# Patient Record
Sex: Male | Born: 1995 | Race: White | Hispanic: No | Marital: Single | Smoking: Never smoker
Health system: Southern US, Community
[De-identification: ages and names within clinical notes are randomized; demographics above are authoritative.]

## PROBLEM LIST (undated history)

## (undated) DIAGNOSIS — J45909 Unspecified asthma, uncomplicated: Secondary | ICD-10-CM

## (undated) HISTORY — PX: CHOLECYSTECTOMY: SHX55

---
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
## 2015-07-17 IMAGING — CT CT ABD-PELV W/ CM
2 of 4 series · 17 of 46 positions shown, 19 images · IV contrast (APPLIED)
Comparison: None.

CLINICAL DATA: Abdominal pain, nausea and vomiting for 3 days.

EXAM:
CT ABDOMEN AND PELVIS WITH CONTRAST
TECHNIQUE: Multidetector CT imaging of the abdomen and pelvis was performed
using the standard protocol following bolus administration of
intravenous contrast.
CONTRAST:  100 mL OMNIPAQUE IOHEXOL 300 MG/ML  SOLN

[Series 2: abd/pelvis 5.0 b31f · axial · 0.78mm/px · z∈[+810,+1250]mm · 14 of 96 slices shown, 16 images]
[im 4/96  soft-tissue]
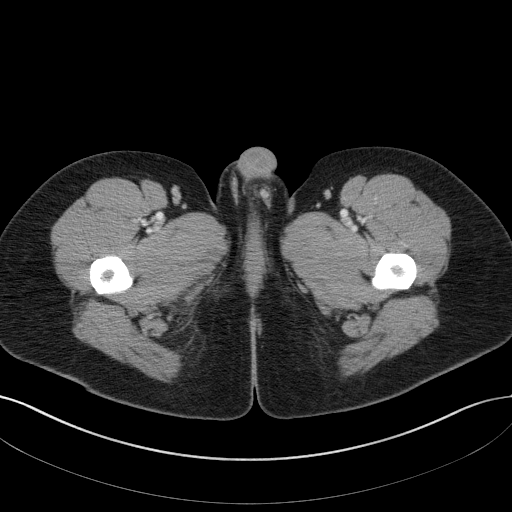
[im 4/96  bone]
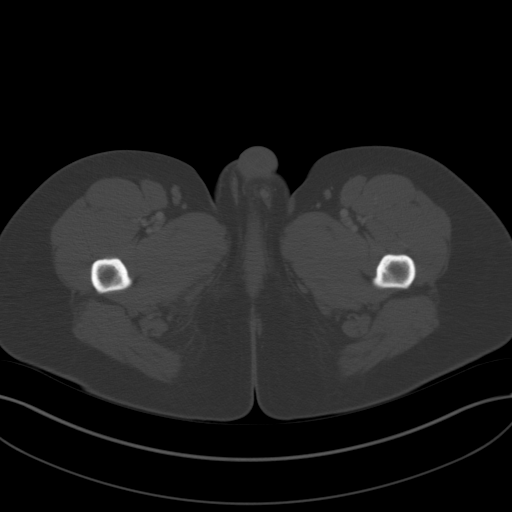
[im 12/96  soft-tissue]
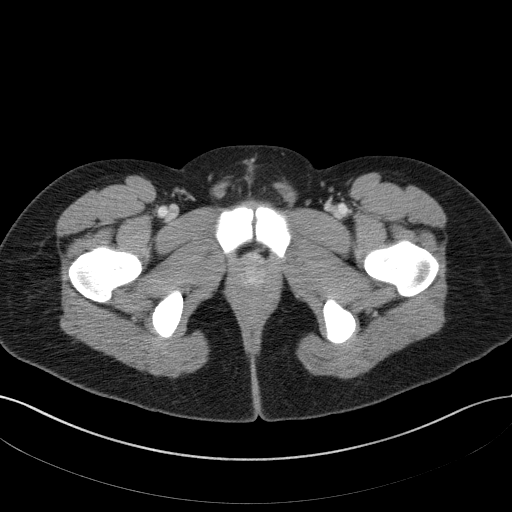
[im 20/96  soft-tissue]
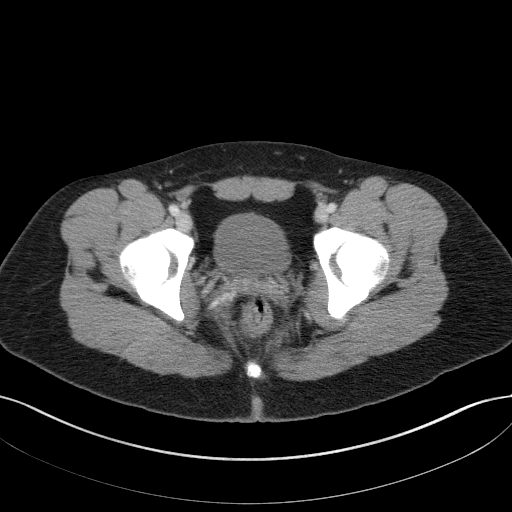
[im 24/96  soft-tissue]
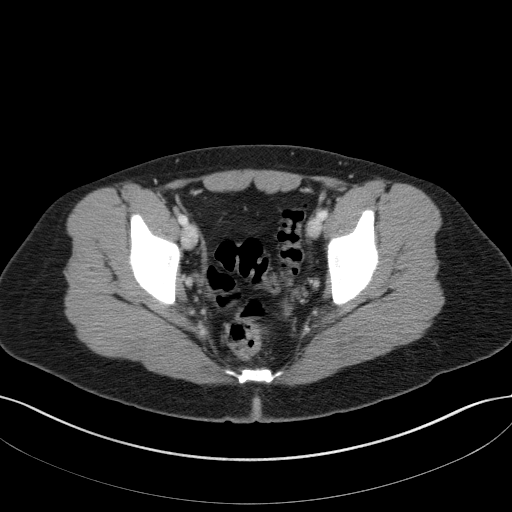
[im 32/96  soft-tissue]
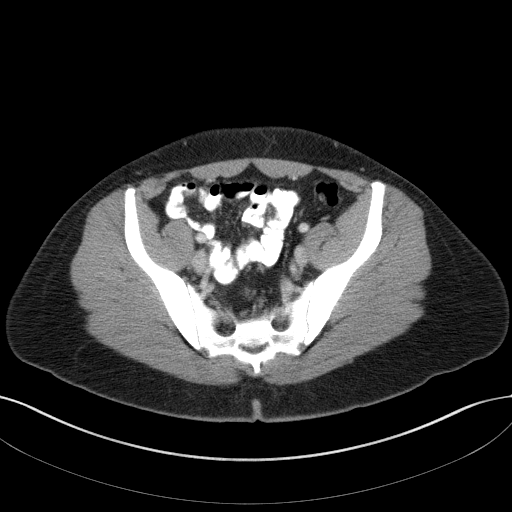
[im 40/96  soft-tissue]
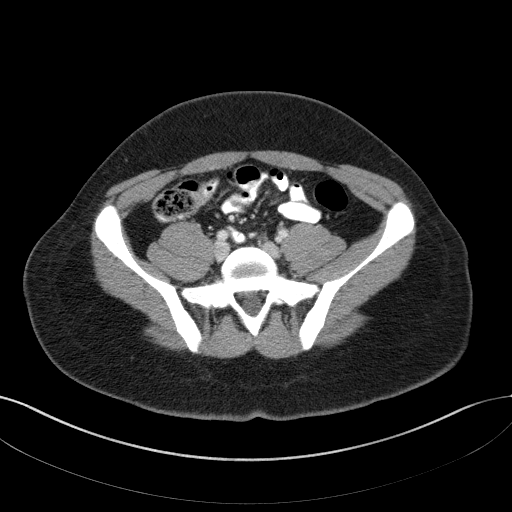
[im 44/96  soft-tissue]
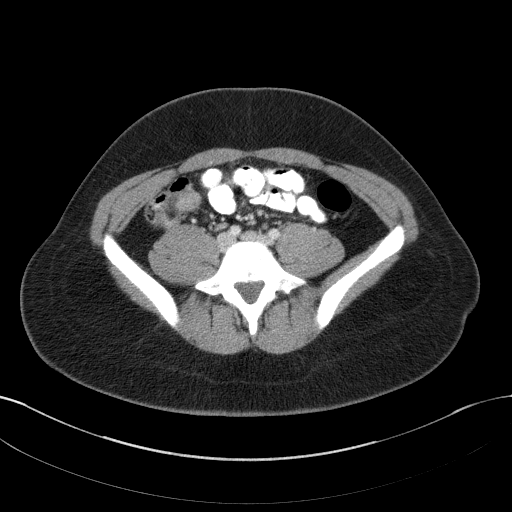
[im 52/96  soft-tissue]
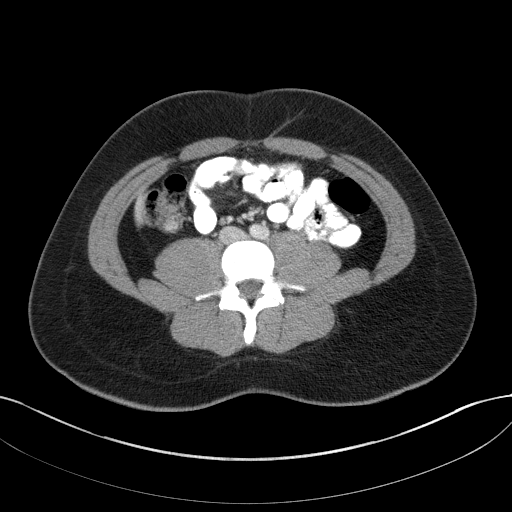
[im 56/96  soft-tissue]
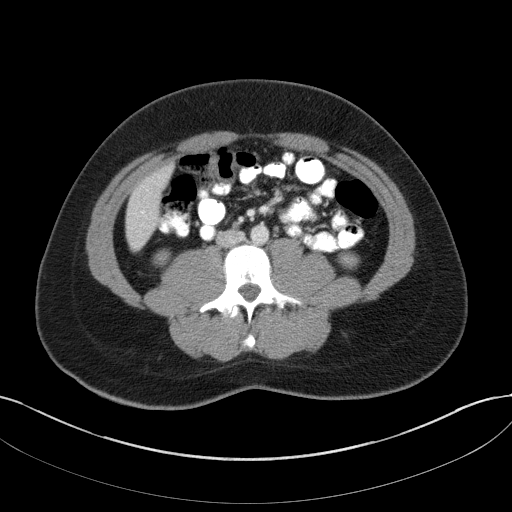
[im 56/96  bone]
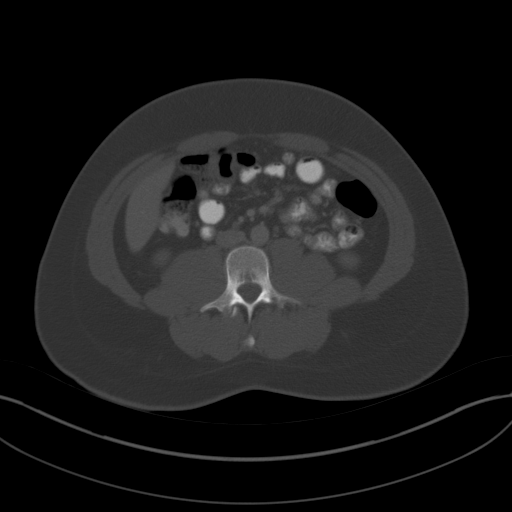
[im 64/96  soft-tissue]
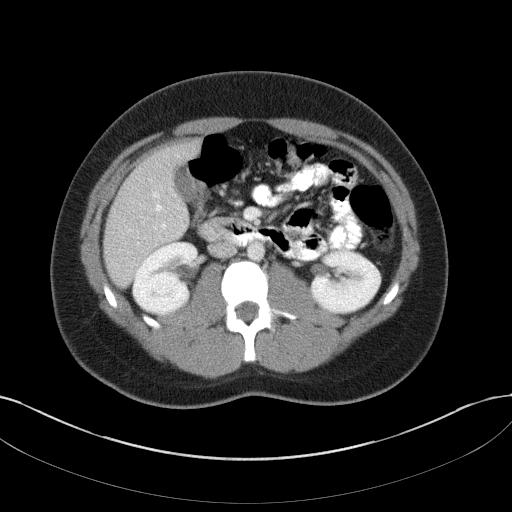
[im 72/96  soft-tissue]
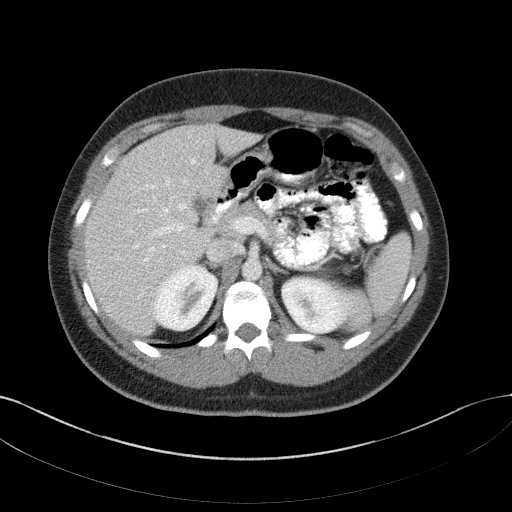
[im 76/96  soft-tissue]
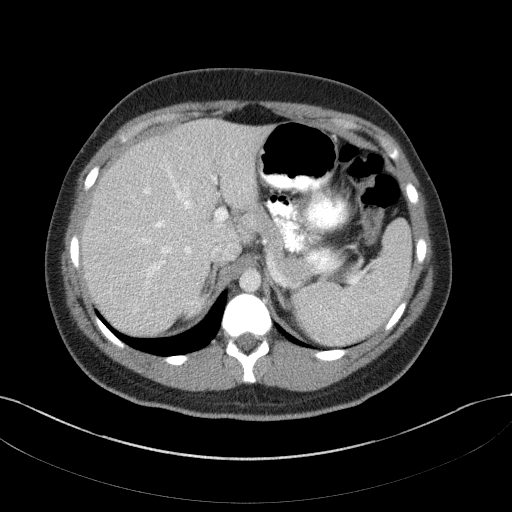
[im 84/96  soft-tissue]
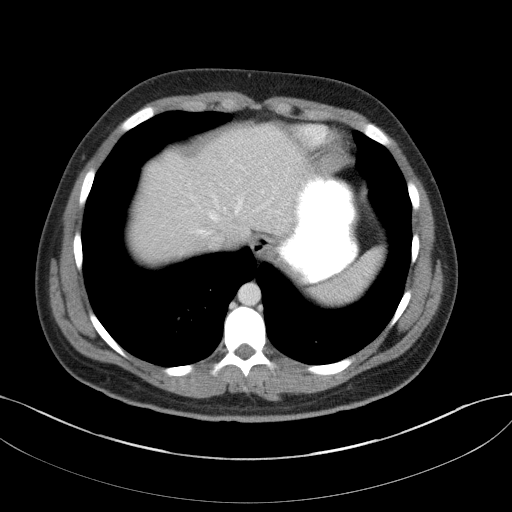
[im 92/96  soft-tissue]
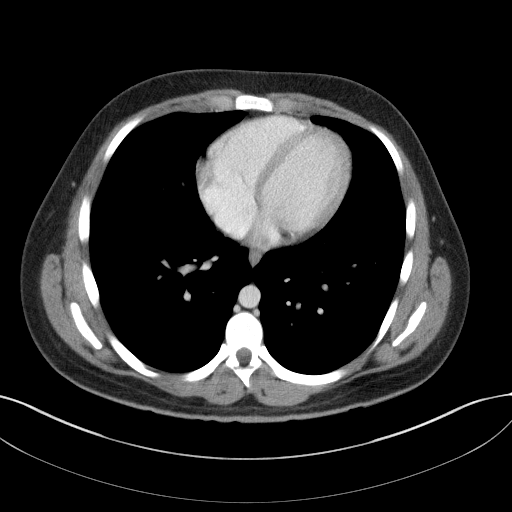

[Series 5: abd/pelvis 3.0 coronal · coronal · 0.87mm/px · 3 of 90 slices shown]
[im 30/90  soft-tissue]
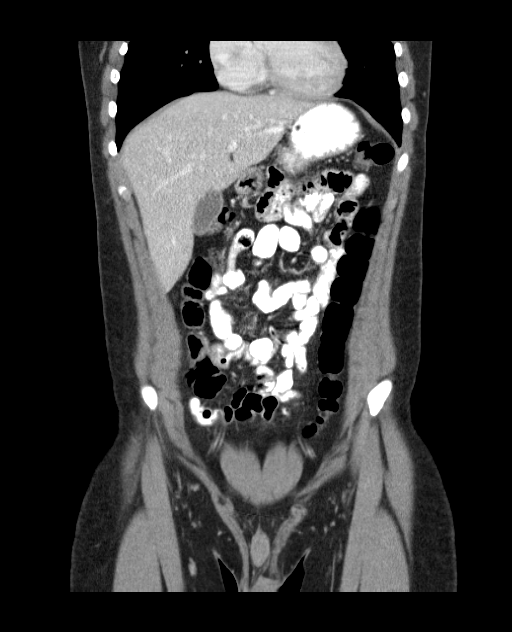
[im 40/90  soft-tissue]
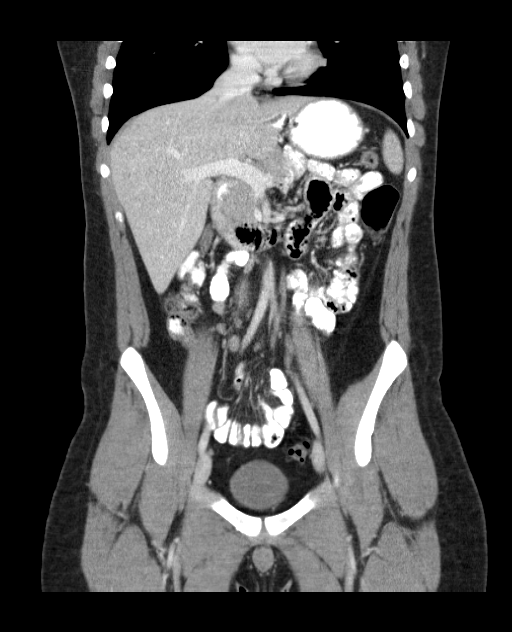
[im 50/90  soft-tissue]
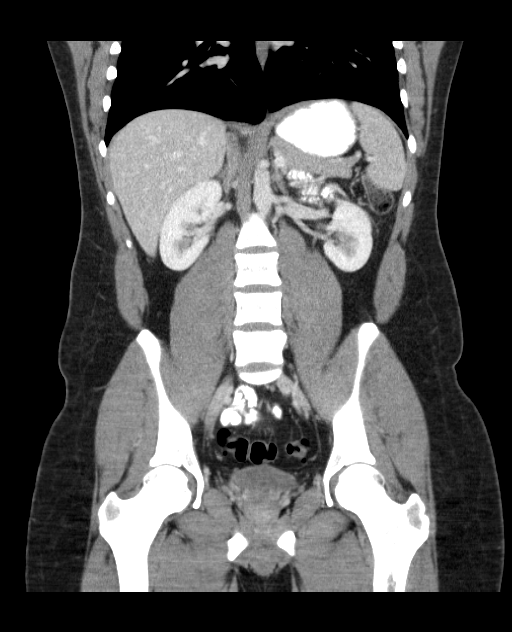

[17 of 46 positions shown; findings below may reference images not displayed]

FINDINGS: The lung bases are clear.  No pleural or pericardial effusion.

A single tiny stone is identified in the gallbladder. There is no CT
evidence of cholecystitis. The liver, spleen, adrenal glands,
pancreas and kidneys appear normal. The stomach, small and large
bowel and appendix appear normal. There is no lymphadenopathy or
fluid. No focal bony abnormality is identified.
IMPRESSION: No acute finding abdomen or pelvis.

A single tiny gallstone is seen but there is no evidence of
cholecystitis.

## 2016-08-05 ENCOUNTER — Emergency Department (HOSPITAL_BASED_OUTPATIENT_CLINIC_OR_DEPARTMENT_OTHER)
Admission: EM | Admit: 2016-08-05 | Discharge: 2016-08-05 | Disposition: A | Discharge status: Home / Self Care | Payer: Worker's Compensation | Attending: Emergency Medicine | Admitting: Emergency Medicine

## 2016-08-05 ENCOUNTER — Encounter (HOSPITAL_BASED_OUTPATIENT_CLINIC_OR_DEPARTMENT_OTHER): Payer: Self-pay

## 2016-08-05 ENCOUNTER — Emergency Department (HOSPITAL_BASED_OUTPATIENT_CLINIC_OR_DEPARTMENT_OTHER): Payer: Worker's Compensation

## 2016-08-05 DIAGNOSIS — J45909 Unspecified asthma, uncomplicated: Secondary | ICD-10-CM | POA: Insufficient documentation

## 2016-08-05 DIAGNOSIS — Z23 Encounter for immunization: Secondary | ICD-10-CM | POA: Insufficient documentation

## 2016-08-05 DIAGNOSIS — W268XXS Contact with other sharp object(s), not elsewhere classified, sequela: Secondary | ICD-10-CM | POA: Insufficient documentation

## 2016-08-05 DIAGNOSIS — S61212S Laceration without foreign body of right middle finger without damage to nail, sequela: Secondary | ICD-10-CM | POA: Insufficient documentation

## 2016-08-05 MED ORDER — TETANUS-DIPHTH-ACELL PERTUSSIS 5-2.5-18.5 LF-MCG/0.5 IM SUSP
0.5000 mL | Freq: Once | INTRAMUSCULAR | Status: AC
  Administered 2016-08-05: 0.5 mL via INTRAMUSCULAR
  Filled 2016-08-05: qty 0.5

## 2016-08-05 NOTE — ED Provider Notes (Addendum)
[facility] Provider [facility]   CSN: [id number] Arrival date & time: 08/05/16  1953   By signing my name below, I, Clovis Pu, attest that this documentation has been prepared under the direction and in the presence of Loren Racer, MD  Electronically Signed: Clovis Pu, ED Scribe. 08/05/16. 9:09 PM.   History   Chief Complaint Chief Complaint  Patient presents with  . Finger Injury   The history is provided by the patient. No language interpreter was used.   HPI Comments:  Anthony Oconnor is a 20 y.o. male who presents to the Emergency Department complaining of a laceration to his R Middle finger which occurred 30-40 minutes PTA. Pt states he cut it on the side of a clean dishwasher. He is R-hand dominant. Tetanus status is unknown. No alleviating factors noted. Pt denies any other complaints at this time.   Past Medical History:  Diagnosis Date  . Asthma     There are no active problems to display for this patient.   Past Surgical History:  Procedure Laterality Date  . CHOLECYSTECTOMY         Home Medications    Prior to Admission medications   Not on File    Family History No family history on file.  Social History Social History  Substance Use Topics  . Smoking status: Never Smoker  . Smokeless tobacco: Never Used  . Alcohol use No     Allergies   Penicillins   Review of Systems Review of Systems  Skin: Positive for wound.  Neurological: Negative for weakness and numbness.  All other systems reviewed and are negative.    Physical Exam Updated Vital Signs BP 142/90 (BP Location: Left Arm)   Pulse 100   Temp 98.6 F (37 C) (Oral)   Resp 20   Ht 5\' 7"  (1.702 m)   Wt 185 lb (83.9 kg)   SpO2 99%   BMI 28.98 kg/m   Physical Exam  Constitutional: He is oriented to person, place, and time. He appears well-developed and well-nourished.  HENT:  Head: Normocephalic and atraumatic.  Eyes: EOM are normal. Pupils are equal,  round, and reactive to light.  Neck: Normal range of motion. Neck supple.  Cardiovascular: Normal rate.   Pulmonary/Chest: Effort normal.  Abdominal: Soft. There is no tenderness. There is no rebound and no guarding.  Musculoskeletal: Normal range of motion. He exhibits no edema or tenderness.  Patient with 2 cm dogear laceration to the dorsum of the right middle finger just distal to the DIP joint. Appears superficial in nature. No underlying ligamentous injury appreciated. Mild bleeding. Good distal cap refill. No gross contamination. Patient has full range of motion of the MCP DIP and PIP joints of the middle finger of the right hand.  Neurological: He is alert and oriented to person, place, and time.  Sensation is fully intact. Good flexion and extension of the fingers of the right hand.  Skin: Skin is warm and dry. Capillary refill takes less than 2 seconds. No rash noted. No erythema.  Psychiatric: He has a normal mood and affect. His behavior is normal.  Nursing note and vitals reviewed.    ED Treatments / Results  DIAGNOSTIC STUDIES:  Oxygen Saturation is 99% on RA, normal by my interpretation.    COORDINATION OF CARE:  9:08 PM Discussed treatment plan with pt at bedside and pt agreed to plan.  Labs (all labs ordered are listed, but only abnormal results are displayed) Labs Reviewed -  No data to display  EKG  EKG Interpretation None       Radiology Dg Hand Complete Right  Result Date: 08/05/2016 CLINICAL DATA:  Third digit laceration EXAM: RIGHT HAND - COMPLETE 3+ VIEW COMPARISON:  None. FINDINGS: Bandaging is noted in place. No acute bony abnormality is seen. No soft tissue foreign body is seen. IMPRESSION: No acute abnormality noted. Electronically Signed   By: Alcide CleverMark  Lukens M.D.   On: 08/05/2016 21:45    Procedures Procedures (including critical care time) LACERATION REPAIR Performed by: Loren RacerYELVERTON, Marnell Mcdaniel Authorized by: Ranae PalmsYELVERTON, Nancy Arvin Consent: Verbal consent  obtained. Risks and benefits: risks, benefits and alternatives were discussed Consent given by: patient Patient identity confirmed: provided demographic data Prepped and Draped in normal sterile fashion Wound explored  Laceration Location: Right middle finger  Laceration Length: 2cm  No Foreign Bodies seen or palpated  Anesthesia: None     Irrigation method: syringe Amount of cleaning: standard  Skin closure: The skin adhesive and Steri-Strips   Number of sutures: N/a   Patient tolerance: Patient tolerated the procedure well with no immediate complications. Medications Ordered in ED Medications  Tdap (BOOSTRIX) injection 0.5 mL (0.5 mLs Intramuscular Given 08/05/16 2115)     Initial Impression / Assessment and Plan / ED Course  I have reviewed the triage vital signs and the nursing notes.  Pertinent labs & imaging results that were available during my care of the patient were reviewed by me and considered in my medical decision making (see chart for details).  Clinical Course    Tetanus was updated. Wound was irrigated thoroughly. Steri-Strips and skin adhesive glue placed. Bleeding is controlled.  Final Clinical Impressions(s) / ED Diagnoses   Final diagnoses:  Laceration of right middle finger without foreign body without damage to nail, sequela    New Prescriptions New Prescriptions   No medications on file  I personally performed the services described in this documentation, which was scribed in my presence. The recorded information has been reviewed and is accurate.       Loren Raceravid Trenna Kiely, MD 08/05/16 2228    Loren Raceravid Oakley Orban, MD 08/05/16 2245

## 2016-08-05 NOTE — ED Notes (Signed)
Patient supervisor present to verify patient identity.
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
## 2016-08-05 NOTE — ED Notes (Signed)
Patient transported to X-ray 

## 2016-08-05 NOTE — ED Notes (Signed)
Finger irrigated with normal saline and nonstick pressure dressing put on for xray

## 2016-08-05 NOTE — ED Triage Notes (Addendum)
Cut right middle finger on side of dishwasher at work approx 20 min PTA-lac noted-no bleeding-gauze,kling dsg applied in triage

## 2017-09-08 IMAGING — CR DG HAND COMPLETE 3+V*R*
3 series · 3 of 3 positions shown · non-contrast
Comparison: None.

CLINICAL DATA: Third digit laceration

EXAM:
RIGHT HAND - COMPLETE 3+ VIEW

[x hand pa right]
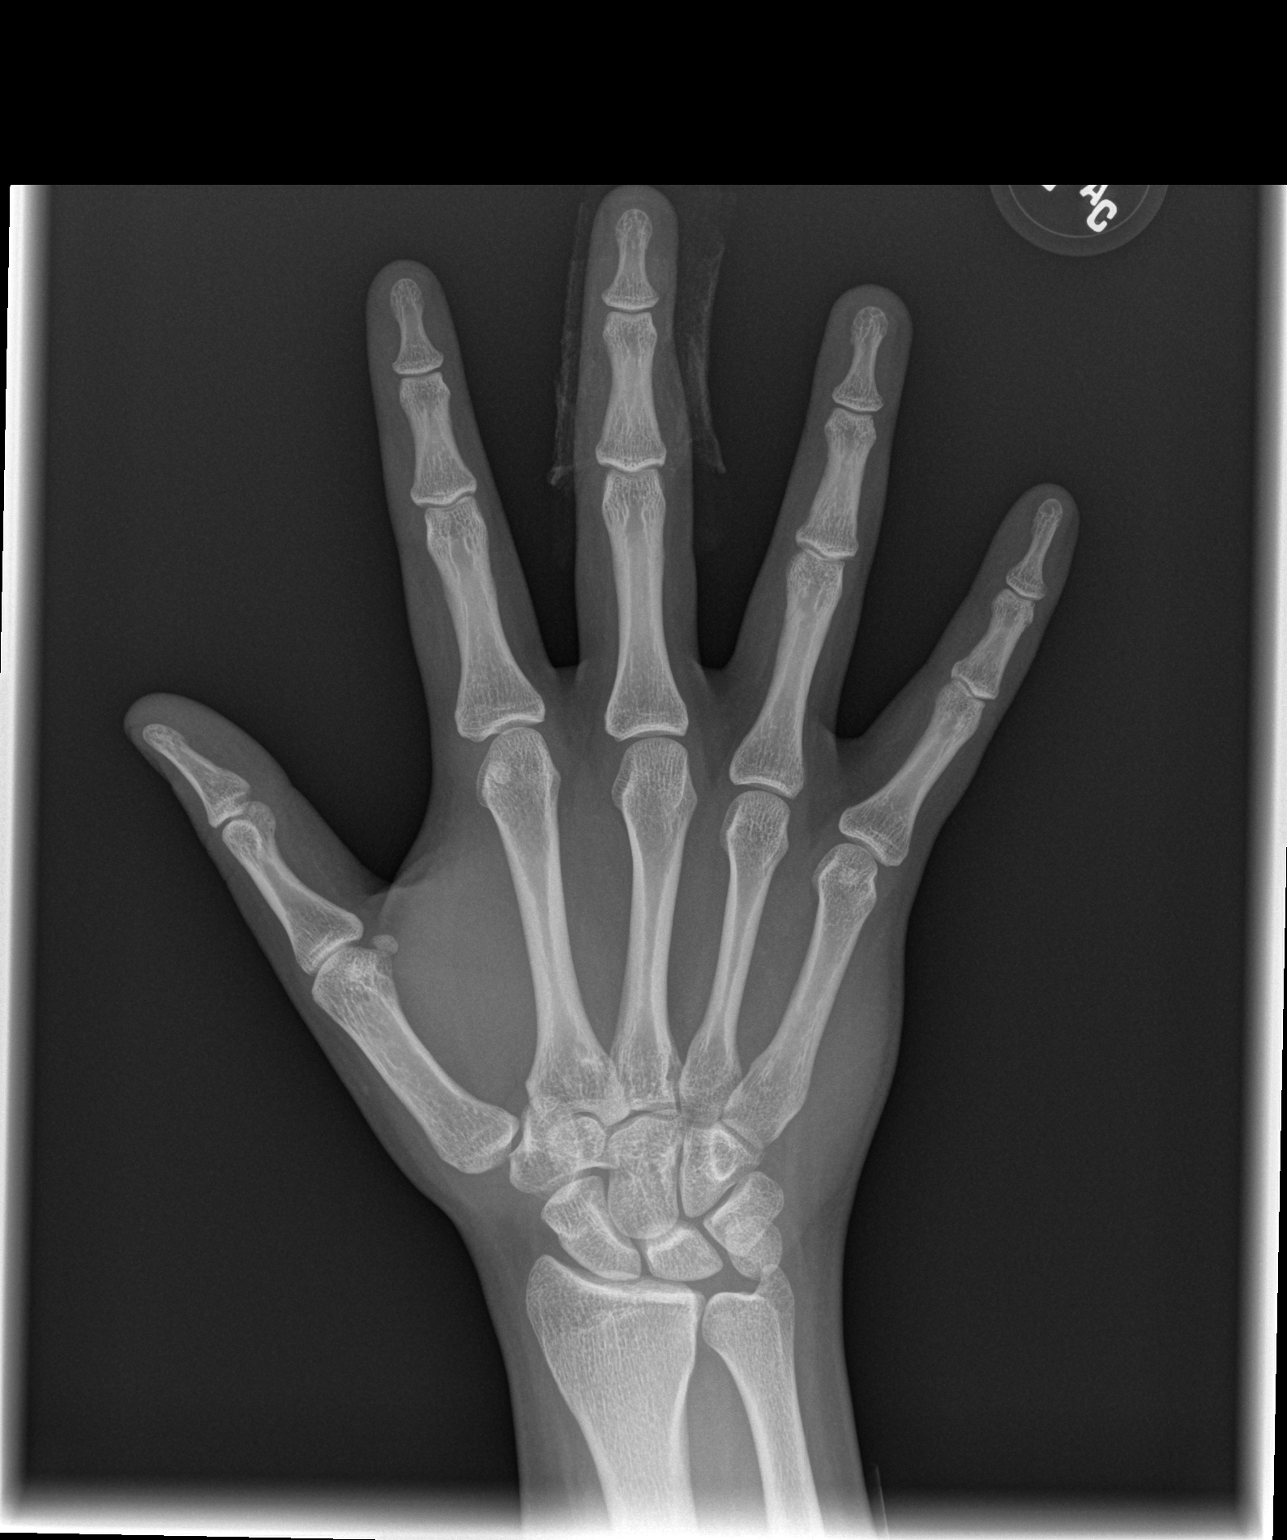

[x hand oblique right]
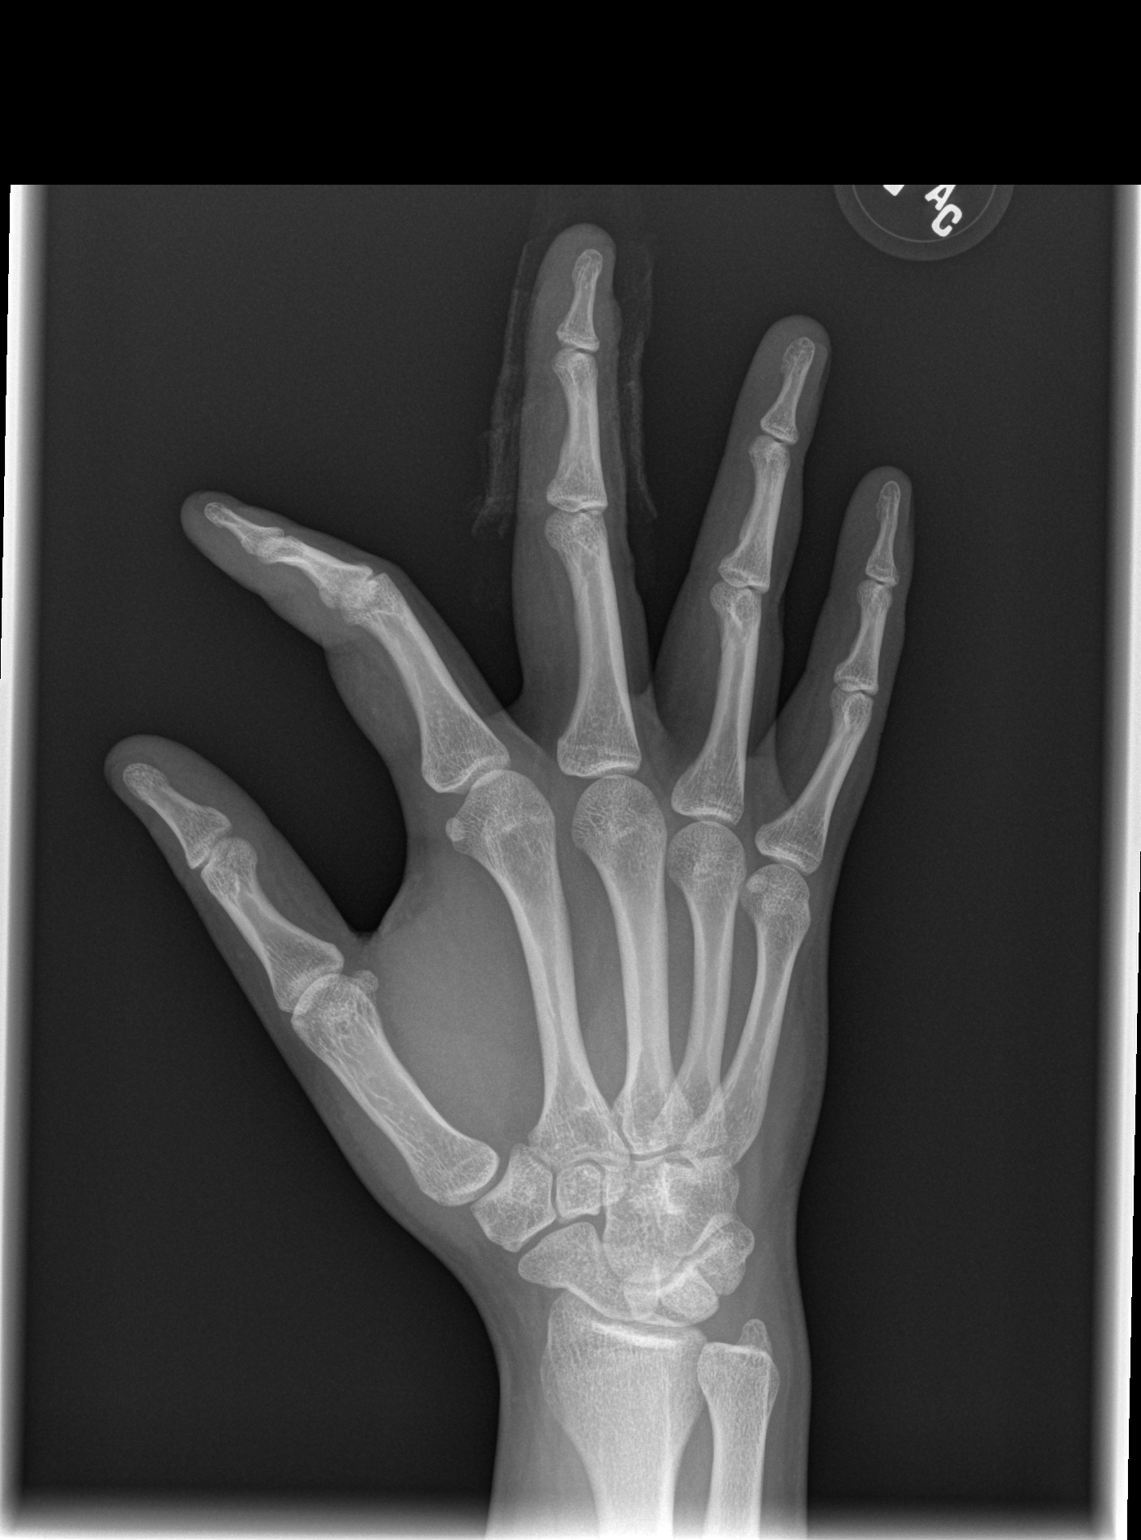

[x hand lat right]
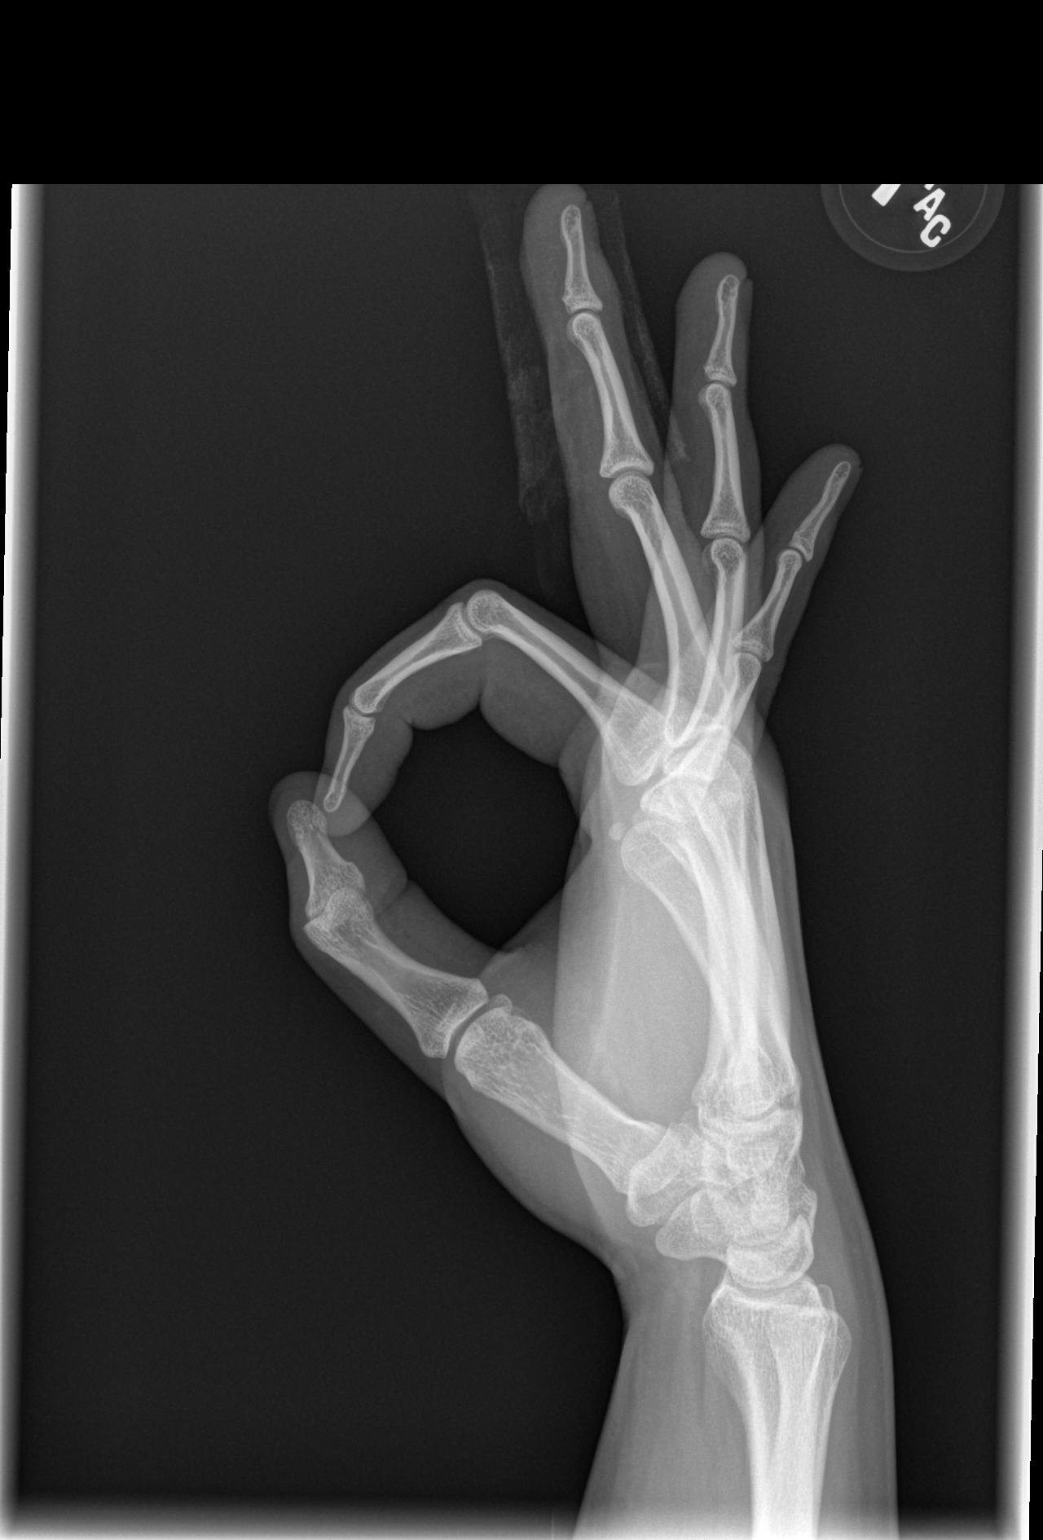

[3 of 3 positions shown; findings below may reference images not displayed]

FINDINGS: Bandaging is noted in place. No acute bony abnormality is seen. No
soft tissue foreign body is seen.
IMPRESSION: No acute abnormality noted.

## 2018-11-14 ENCOUNTER — Other Ambulatory Visit: Payer: Self-pay

## 2018-11-14 ENCOUNTER — Encounter (HOSPITAL_BASED_OUTPATIENT_CLINIC_OR_DEPARTMENT_OTHER): Payer: Self-pay

## 2018-11-14 ENCOUNTER — Emergency Department (HOSPITAL_BASED_OUTPATIENT_CLINIC_OR_DEPARTMENT_OTHER)
Admission: EM | Admit: 2018-11-14 | Discharge: 2018-11-14 | Disposition: A | Discharge status: Home / Self Care | Payer: Self-pay | Attending: Emergency Medicine | Admitting: Emergency Medicine

## 2018-11-14 DIAGNOSIS — Y9389 Activity, other specified: Secondary | ICD-10-CM | POA: Insufficient documentation

## 2018-11-14 DIAGNOSIS — Y990 Civilian activity done for income or pay: Secondary | ICD-10-CM | POA: Insufficient documentation

## 2018-11-14 DIAGNOSIS — M25561 Pain in right knee: Secondary | ICD-10-CM | POA: Insufficient documentation

## 2018-11-14 DIAGNOSIS — R600 Localized edema: Secondary | ICD-10-CM | POA: Insufficient documentation

## 2018-11-14 DIAGNOSIS — X501XXA Overexertion from prolonged static or awkward postures, initial encounter: Secondary | ICD-10-CM | POA: Insufficient documentation

## 2018-11-14 DIAGNOSIS — J45909 Unspecified asthma, uncomplicated: Secondary | ICD-10-CM | POA: Insufficient documentation

## 2018-11-14 DIAGNOSIS — Y9289 Other specified places as the place of occurrence of the external cause: Secondary | ICD-10-CM | POA: Insufficient documentation

## 2018-11-14 NOTE — ED Provider Notes (Signed)
[facility] EMERGENCY DEPARTMENT Provider Note   CSN: [id number] Arrival date & time: 11/14/18  1631     History   Chief Complaint Chief Complaint  Patient presents with  . Knee Pain    HPI Anthony Oconnor is a 23 y.o. male.  The history is provided by the patient and medical records. No language interpreter was used.  Knee Pain   Anthony Oconnor is a 23 y.o. male  with no pertinent past medical history who presents to the Emergency Department complaining of improving right knee pain.  Patient states his knee began bothering him about 2 months ago.  He denies any known injury or inciting event.  Mostly the lateral aspect of his knee.  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  About a week ago after several days of working in a row, his knee began to swell and was causing him more discomfort than usual.  He took a few days off of work.  He tried to go back to work today and was told that he needed a work note in order to return.  He feels that his knee is much improved following rest, staying off of it for several days and applying ice.  He does still have a little pain when he moves his knee a certain direction.  The swelling is now gone.    Past Medical History:  Diagnosis Date  . Asthma     There are no active problems to display for this patient.   Past Surgical History:  Procedure Laterality Date  . CHOLECYSTECTOMY          Home Medications    Prior to Admission medications   Not on File    Family History No family history on file.  Social History Social History   Tobacco Use  . Smoking status: Never Smoker  . Smokeless tobacco: Never Used  Substance Use Topics  . Alcohol use: No  . Drug use: No     Allergies   Penicillins   Review of Systems Review of Systems  Musculoskeletal: Positive for arthralgias, joint swelling (Resolved) and myalgias.  Skin: Negative for color change and wound.  Neurological:  Negative for weakness and numbness.     Physical Exam Updated Vital Signs BP 132/82 (BP Location: Left Arm)   Pulse 88   Temp 98.3 F (36.8 C) (Oral)   Resp 18   Ht 5\' 6"  (1.676 m)   Wt 81.6 kg   SpO2 99%   BMI 29.05 kg/m   Physical Exam Vitals signs and nursing note reviewed.  Constitutional:      General: He is not in acute distress.    Appearance: He is well-developed.  HENT:     Head: Normocephalic and atraumatic.  Neck:     Musculoskeletal: Neck supple.  Cardiovascular:     Rate and Rhythm: Normal rate and regular rhythm.     Heart sounds: Normal heart sounds. No murmur.  Pulmonary:     Effort: Pulmonary effort is normal. No respiratory distress.     Breath sounds: Normal breath sounds. No wheezing or rales.  Musculoskeletal:     Comments: Mild tenderness to the lateral right knee. Full ROM. No joint line tenderness. No joint effusion or swelling appreciated. No abnormal alignment or patellar mobility. No bruising, erythema or warmth overlaying the joint. No varus/valgus laxity. Negative drawer's, Lachman's and McMurray's.  No crepitus. 2+ DP pulses bilaterally. All  compartments are soft. Sensation intact distal to injury.  Skin:    General: Skin is warm and dry.  Neurological:     Mental Status: He is alert.      ED Treatments / Results  Labs (all labs ordered are listed, but only abnormal results are displayed) Labs Reviewed - No data to display  EKG None  Radiology No results found.  Procedures Procedures (including critical care time)  Medications Ordered in ED Medications - No data to display   Initial Impression / Assessment and Plan / ED Course  I have reviewed the triage vital signs and the nursing notes.  Pertinent labs & imaging results that were available during my care of the patient were reviewed by me and considered in my medical decision making (see chart for details).    Anthony Oconnor is a 23 y.o. male who presents to ED for  improving right knee pain.  Neurovascularly intact on exam.  Ligaments intact as well.  Ambulatory without any difficulty.  Requesting note so that he can go back to work which was provided.  Orthopedic referral given if symptoms return when he goes back to work. Return precautions discussed and all questions answered.   Final Clinical Impressions(s) / ED Diagnoses   Final diagnoses:  Right knee pain, unspecified chronicity    ED Discharge Orders    None       Virgia Kelner, Chase Picket, PA-C 11/14/18 1830    Mesner, Barbara Cower, MD 11/15/18 [phone]

## 2018-11-14 NOTE — ED Triage Notes (Signed)
Pt c/o right knee pain and swelling, states he was out of work all last week and needs a note to go back along with getting his knee check out, although it has been improving

## 2018-11-14 NOTE — Discharge Instructions (Addendum)
It was my pleasure taking care of you today!   As we discussed, you should call the orthopedic doctor listed tomorrow morning to schedule a follow up appointment.   Return to ER for new or worsening symptoms, any additional concerns.

## 2022-05-29 ENCOUNTER — Encounter (HOSPITAL_BASED_OUTPATIENT_CLINIC_OR_DEPARTMENT_OTHER): Payer: Self-pay | Admitting: Emergency Medicine

## 2022-05-29 ENCOUNTER — Emergency Department (HOSPITAL_BASED_OUTPATIENT_CLINIC_OR_DEPARTMENT_OTHER)
Admission: EM | Admit: 2022-05-29 | Discharge: 2022-05-29 | Disposition: A | Discharge status: Home / Self Care | Payer: BLUE CROSS/BLUE SHIELD | Attending: Emergency Medicine | Admitting: Emergency Medicine

## 2022-05-29 ENCOUNTER — Emergency Department (HOSPITAL_BASED_OUTPATIENT_CLINIC_OR_DEPARTMENT_OTHER): Payer: BLUE CROSS/BLUE SHIELD

## 2022-05-29 ENCOUNTER — Other Ambulatory Visit: Payer: Self-pay

## 2022-05-29 DIAGNOSIS — Y92410 Unspecified street and highway as the place of occurrence of the external cause: Secondary | ICD-10-CM | POA: Insufficient documentation

## 2022-05-29 DIAGNOSIS — M542 Cervicalgia: Secondary | ICD-10-CM | POA: Insufficient documentation

## 2022-05-29 DIAGNOSIS — M545 Low back pain, unspecified: Secondary | ICD-10-CM | POA: Diagnosis not present

## 2022-05-29 DIAGNOSIS — M79645 Pain in left finger(s): Secondary | ICD-10-CM | POA: Diagnosis present

## 2022-05-29 MED ORDER — IBUPROFEN 400 MG PO TABS
600.0000 mg | ORAL_TABLET | Freq: Once | ORAL | Status: AC
  Administered 2022-05-29: 600 mg via ORAL
  Filled 2022-05-29: qty 1

## 2022-05-29 MED ORDER — METHOCARBAMOL 500 MG PO TABS
500.0000 mg | ORAL_TABLET | Freq: Once | ORAL | Status: AC
  Administered 2022-05-29: 500 mg via ORAL
  Filled 2022-05-29: qty 1

## 2022-05-29 MED ORDER — METHOCARBAMOL 500 MG PO TABS: 500.0000 mg | ORAL_TABLET | Freq: Two times a day (BID) | ORAL | 0 refills | Status: AC
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
## 2022-09-15 ENCOUNTER — Emergency Department (HOSPITAL_BASED_OUTPATIENT_CLINIC_OR_DEPARTMENT_OTHER)
Admission: EM | Admit: 2022-09-15 | Discharge: 2022-09-15 | Disposition: A | Discharge status: Home / Self Care | Payer: BLUE CROSS/BLUE SHIELD | Attending: Emergency Medicine | Admitting: Emergency Medicine

## 2022-09-15 ENCOUNTER — Encounter (HOSPITAL_BASED_OUTPATIENT_CLINIC_OR_DEPARTMENT_OTHER): Payer: Self-pay | Admitting: Emergency Medicine

## 2022-09-15 ENCOUNTER — Other Ambulatory Visit: Payer: Self-pay

## 2022-09-15 DIAGNOSIS — Z20828 Contact with and (suspected) exposure to other viral communicable diseases: Secondary | ICD-10-CM | POA: Diagnosis not present

## 2022-09-15 DIAGNOSIS — R059 Cough, unspecified: Secondary | ICD-10-CM | POA: Diagnosis present

## 2022-09-15 DIAGNOSIS — J069 Acute upper respiratory infection, unspecified: Secondary | ICD-10-CM | POA: Diagnosis not present

## 2022-09-15 NOTE — ED Notes (Signed)
Pt d/c home per MD order. Discharge summary reviewed, pt verbalizes understanding. No s/s of acute distress noted.  

## 2022-09-15 NOTE — ED Triage Notes (Signed)
Pt arrives to ED with c/o RSV testing. He notes flu like symptoms x1 week.

## 2022-09-15 NOTE — Discharge Instructions (Signed)
Return for any new or worse symptoms.  Since your mother has RSV high probability that the symptoms that started about 3 weeks ago were RSV related.  No specific treatment just symptomatic treatment over-the-counter medications as needed.

## 2022-09-15 NOTE — ED Provider Notes (Signed)
[facility] EMERGENCY DEPT Provider Note   CSN: [id number] Arrival date & time: 09/15/22  6712     History  Chief Complaint  Patient presents with   RSV testing    Pradeep Beaubrun is a 26 y.o. male.  Patient sent in along with other family members for concerns for RSV exposure.  Patient's had upper respiratory type symptoms for about a week.  Patient in no extremis.  I family members in the hospital with COPD and RSV complications and their physician recommended that they get checked.  Oxygen sat on room air is 100% respiration rate of 16 temp is 98.3.  Patient has no clinical concerns are worried about being sick.  Past medical history is significant for asthma.  Denies any breathing difficulties.       Home Medications Prior to Admission medications   Not on File      Allergies    Penicillins    Review of Systems   Review of Systems  Constitutional:  Negative for chills and fever.  HENT:  Positive for congestion. Negative for ear pain and sore throat.   Eyes:  Negative for pain and visual disturbance.  Respiratory:  Positive for cough. Negative for shortness of breath.   Cardiovascular:  Negative for chest pain and palpitations.  Gastrointestinal:  Negative for abdominal pain and vomiting.  Genitourinary:  Negative for dysuria and hematuria.  Musculoskeletal:  Negative for arthralgias and back pain.  Skin:  Negative for color change and rash.  Neurological:  Negative for seizures and syncope.  All other systems reviewed and are negative.   Physical Exam Updated Vital Signs BP 126/81 (BP Location: Left Arm)   Pulse 100   Temp 98.3 F (36.8 C) (Oral)   Resp 16   Ht 1.676 m (5\' 6" )   Wt 88.5 kg   SpO2 100%   BMI 31.47 kg/m  Physical Exam Vitals and nursing note reviewed.  Constitutional:      General: He is not in acute distress.    Appearance: Normal appearance. He is well-developed. He is not ill-appearing.  HENT:     Head: Normocephalic and  atraumatic.  Eyes:     Conjunctiva/sclera: Conjunctivae normal.  Cardiovascular:     Rate and Rhythm: Normal rate and regular rhythm.     Heart sounds: No murmur heard. Pulmonary:     Effort: Pulmonary effort is normal. No respiratory distress.     Breath sounds: Normal breath sounds. No stridor. No wheezing, rhonchi or rales.  Abdominal:     Palpations: Abdomen is soft.     Tenderness: There is no abdominal tenderness.  Musculoskeletal:        General: No swelling.     Cervical back: Neck supple.  Skin:    General: Skin is warm and dry.     Capillary Refill: Capillary refill takes less than 2 seconds.  Neurological:     General: No focal deficit present.     Mental Status: He is alert and oriented to person, place, and time.  Psychiatric:        Mood and Affect: Mood normal.     ED Results / Procedures / Treatments   Labs (all labs ordered are listed, but only abnormal results are displayed) Labs Reviewed - No data to display  EKG None  Radiology No results found.  Procedures Procedures    Medications Ordered in ED Medications - No data to display  ED Course/ Medical Decision Making/ A&P  Medical Decision Making  Patient nontoxic no acute distress.  Lungs are clear.  No indication for chest x-ray.  Other family members being tested for RSV but the likelihood since they have a family member in the hospital with RSV but they have all had exposure to RSV.  But even if they are positive no specific treatment indicated at this time.  Final Clinical Impression(s) / ED Diagnoses Final diagnoses:  Upper respiratory tract infection, unspecified type  RSV exposure    Rx / DC Orders ED Discharge Orders     None         Vanetta Mulders, MD 09/15/22 1044

## 2023-10-13 DIAGNOSIS — Z419 Encounter for procedure for purposes other than remedying health state, unspecified: Secondary | ICD-10-CM | POA: Diagnosis not present

## 2023-11-13 DIAGNOSIS — Z419 Encounter for procedure for purposes other than remedying health state, unspecified: Secondary | ICD-10-CM | POA: Diagnosis not present

## 2023-12-11 DIAGNOSIS — Z419 Encounter for procedure for purposes other than remedying health state, unspecified: Secondary | ICD-10-CM | POA: Diagnosis not present

## 2024-01-22 DIAGNOSIS — Z419 Encounter for procedure for purposes other than remedying health state, unspecified: Secondary | ICD-10-CM | POA: Diagnosis not present

## 2024-02-21 DIAGNOSIS — Z419 Encounter for procedure for purposes other than remedying health state, unspecified: Secondary | ICD-10-CM | POA: Diagnosis not present

## 2024-03-23 DIAGNOSIS — Z419 Encounter for procedure for purposes other than remedying health state, unspecified: Secondary | ICD-10-CM | POA: Diagnosis not present

## 2024-04-22 DIAGNOSIS — Z419 Encounter for procedure for purposes other than remedying health state, unspecified: Secondary | ICD-10-CM | POA: Diagnosis not present

## 2024-05-23 DIAGNOSIS — Z419 Encounter for procedure for purposes other than remedying health state, unspecified: Secondary | ICD-10-CM | POA: Diagnosis not present

## 2024-06-23 DIAGNOSIS — Z419 Encounter for procedure for purposes other than remedying health state, unspecified: Secondary | ICD-10-CM | POA: Diagnosis not present

## 2024-07-23 DIAGNOSIS — Z419 Encounter for procedure for purposes other than remedying health state, unspecified: Secondary | ICD-10-CM | POA: Diagnosis not present
